# Patient Record
Sex: Male | Born: 2005 | Race: White | Hispanic: No | Marital: Single | Smoking: Never smoker
Health system: Southern US, Community
[De-identification: ages and names within clinical notes are randomized; demographics above are authoritative.]

## PROBLEM LIST (undated history)

## (undated) DIAGNOSIS — F840 Autistic disorder: Secondary | ICD-10-CM

## (undated) DIAGNOSIS — R569 Unspecified convulsions: Secondary | ICD-10-CM

## (undated) HISTORY — DX: Unspecified convulsions: R56.9

---
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
## 2024-01-04 ENCOUNTER — Telehealth (INDEPENDENT_AMBULATORY_CARE_PROVIDER_SITE_OTHER): Payer: MEDICAID | Admitting: Child and Adolescent Psychiatry

## 2024-01-04 DIAGNOSIS — Z91199 Patient's noncompliance with other medical treatment and regimen due to unspecified reason: Secondary | ICD-10-CM

## 2024-01-04 NOTE — Progress Notes (Signed)
 Pt was sent link via text and email to connect on video for telemedicine encounter for scheduled appointment, and was also followed up with phone call. Pt did not connect on the video, and writer could not leave VM for pt it said her number is out of service. Waited until 10:20 AM for her to connect.

## 2024-02-20 ENCOUNTER — Other Ambulatory Visit: Payer: Self-pay | Admitting: Child and Adolescent Psychiatry

## 2024-02-20 DIAGNOSIS — F411 Generalized anxiety disorder: Secondary | ICD-10-CM

## 2024-03-26 ENCOUNTER — Other Ambulatory Visit: Payer: Self-pay | Admitting: Child and Adolescent Psychiatry

## 2024-03-26 DIAGNOSIS — F411 Generalized anxiety disorder: Secondary | ICD-10-CM
